# Patient Record
Sex: Female | Born: 1960 | Race: White | Hispanic: No | Marital: Married | Smoking: Current some day smoker
Health system: Southern US, Community
[De-identification: ages and names within clinical notes are randomized; demographics above are authoritative.]

## PROBLEM LIST (undated history)

## (undated) DIAGNOSIS — F329 Major depressive disorder, single episode, unspecified: Secondary | ICD-10-CM

## (undated) DIAGNOSIS — R000 Tachycardia, unspecified: Secondary | ICD-10-CM

## (undated) DIAGNOSIS — M797 Fibromyalgia: Secondary | ICD-10-CM

## (undated) DIAGNOSIS — B1910 Unspecified viral hepatitis B without hepatic coma: Secondary | ICD-10-CM

## (undated) DIAGNOSIS — M810 Age-related osteoporosis without current pathological fracture: Secondary | ICD-10-CM

## (undated) DIAGNOSIS — B1920 Unspecified viral hepatitis C without hepatic coma: Secondary | ICD-10-CM

## (undated) DIAGNOSIS — M549 Dorsalgia, unspecified: Secondary | ICD-10-CM

## (undated) DIAGNOSIS — E7800 Pure hypercholesterolemia, unspecified: Secondary | ICD-10-CM

## (undated) DIAGNOSIS — F32A Depression, unspecified: Secondary | ICD-10-CM

## (undated) DIAGNOSIS — F419 Anxiety disorder, unspecified: Secondary | ICD-10-CM

## (undated) HISTORY — PX: ROTATOR CUFF REPAIR: SHX139

## (undated) HISTORY — PX: CORNEAL TRANSPLANT: SHX108

---

## 1998-08-28 ENCOUNTER — Ambulatory Visit (HOSPITAL_COMMUNITY): Admission: RE | Admit: 1998-08-28 | Discharge: 1998-08-28 | Payer: Self-pay | Admitting: Sports Medicine

## 1998-08-28 ENCOUNTER — Encounter: Payer: Self-pay | Admitting: Sports Medicine

## 1998-09-26 ENCOUNTER — Ambulatory Visit (HOSPITAL_COMMUNITY): Admission: RE | Admit: 1998-09-26 | Discharge: 1998-09-26 | Payer: Self-pay | Admitting: Sports Medicine

## 1998-09-26 ENCOUNTER — Encounter: Payer: Self-pay | Admitting: Sports Medicine

## 1998-10-16 ENCOUNTER — Ambulatory Visit (HOSPITAL_COMMUNITY): Admission: RE | Admit: 1998-10-16 | Discharge: 1998-10-16 | Payer: Self-pay | Admitting: Family Medicine

## 1998-10-16 ENCOUNTER — Encounter: Payer: Self-pay | Admitting: Family Medicine

## 1998-11-16 ENCOUNTER — Encounter: Admission: RE | Admit: 1998-11-16 | Discharge: 1999-01-15 | Payer: Self-pay | Admitting: Orthopedic Surgery

## 1998-12-14 ENCOUNTER — Other Ambulatory Visit: Admission: RE | Admit: 1998-12-14 | Discharge: 1998-12-14 | Payer: Self-pay | Admitting: Family Medicine

## 1999-01-02 ENCOUNTER — Encounter: Payer: Self-pay | Admitting: Sports Medicine

## 1999-01-02 ENCOUNTER — Ambulatory Visit (HOSPITAL_COMMUNITY): Admission: RE | Admit: 1999-01-02 | Discharge: 1999-01-02 | Payer: Self-pay | Admitting: Sports Medicine

## 2000-08-21 ENCOUNTER — Ambulatory Visit (HOSPITAL_COMMUNITY): Admission: RE | Admit: 2000-08-21 | Discharge: 2000-08-21 | Payer: Self-pay | Admitting: *Deleted

## 2001-08-15 ENCOUNTER — Emergency Department (HOSPITAL_COMMUNITY): Admission: EM | Admit: 2001-08-15 | Discharge: 2001-08-15 | Payer: Self-pay | Admitting: Emergency Medicine

## 2001-08-24 ENCOUNTER — Ambulatory Visit (HOSPITAL_COMMUNITY): Admission: RE | Admit: 2001-08-24 | Discharge: 2001-08-24 | Payer: Self-pay | Admitting: *Deleted

## 2001-08-28 ENCOUNTER — Encounter: Admission: RE | Admit: 2001-08-28 | Discharge: 2001-08-28 | Payer: Self-pay | Admitting: *Deleted

## 2002-01-24 ENCOUNTER — Encounter: Payer: Self-pay | Admitting: Sports Medicine

## 2002-01-24 ENCOUNTER — Encounter: Admission: RE | Admit: 2002-01-24 | Discharge: 2002-01-24 | Payer: Self-pay | Admitting: Sports Medicine

## 2002-02-07 ENCOUNTER — Encounter: Admission: RE | Admit: 2002-02-07 | Discharge: 2002-02-07 | Payer: Self-pay | Admitting: Sports Medicine

## 2002-02-07 ENCOUNTER — Encounter: Payer: Self-pay | Admitting: Sports Medicine

## 2002-02-21 ENCOUNTER — Encounter: Payer: Self-pay | Admitting: Sports Medicine

## 2002-02-21 ENCOUNTER — Encounter: Admission: RE | Admit: 2002-02-21 | Discharge: 2002-02-21 | Payer: Self-pay | Admitting: Sports Medicine

## 2002-08-31 ENCOUNTER — Ambulatory Visit (HOSPITAL_COMMUNITY): Admission: RE | Admit: 2002-08-31 | Discharge: 2002-08-31 | Payer: Self-pay | Admitting: Family Medicine

## 2002-08-31 ENCOUNTER — Encounter: Payer: Self-pay | Admitting: Family Medicine

## 2003-02-10 ENCOUNTER — Ambulatory Visit (HOSPITAL_COMMUNITY): Admission: RE | Admit: 2003-02-10 | Discharge: 2003-02-10 | Payer: Self-pay | Admitting: Internal Medicine

## 2003-03-13 ENCOUNTER — Other Ambulatory Visit: Admission: RE | Admit: 2003-03-13 | Discharge: 2003-03-13 | Payer: Self-pay | Admitting: Family Medicine

## 2009-02-12 ENCOUNTER — Ambulatory Visit: Payer: Self-pay | Admitting: Occupational Medicine

## 2009-02-12 DIAGNOSIS — I10 Essential (primary) hypertension: Secondary | ICD-10-CM | POA: Insufficient documentation

## 2009-02-12 DIAGNOSIS — E785 Hyperlipidemia, unspecified: Secondary | ICD-10-CM | POA: Insufficient documentation

## 2009-02-12 DIAGNOSIS — G609 Hereditary and idiopathic neuropathy, unspecified: Secondary | ICD-10-CM | POA: Insufficient documentation

## 2009-02-12 DIAGNOSIS — F329 Major depressive disorder, single episode, unspecified: Secondary | ICD-10-CM | POA: Insufficient documentation

## 2009-02-12 DIAGNOSIS — F411 Generalized anxiety disorder: Secondary | ICD-10-CM | POA: Insufficient documentation

## 2010-03-30 ENCOUNTER — Encounter: Payer: Self-pay | Admitting: Family Medicine

## 2013-09-09 ENCOUNTER — Encounter (HOSPITAL_BASED_OUTPATIENT_CLINIC_OR_DEPARTMENT_OTHER): Payer: Self-pay | Admitting: Emergency Medicine

## 2013-09-09 ENCOUNTER — Emergency Department (HOSPITAL_BASED_OUTPATIENT_CLINIC_OR_DEPARTMENT_OTHER)
Admission: EM | Admit: 2013-09-09 | Discharge: 2013-09-09 | Disposition: A | Discharge status: Home / Self Care | Payer: Medicaid Other | Attending: Emergency Medicine | Admitting: Emergency Medicine

## 2013-09-09 DIAGNOSIS — Z79899 Other long term (current) drug therapy: Secondary | ICD-10-CM | POA: Insufficient documentation

## 2013-09-09 DIAGNOSIS — G8929 Other chronic pain: Secondary | ICD-10-CM

## 2013-09-09 DIAGNOSIS — R000 Tachycardia, unspecified: Secondary | ICD-10-CM | POA: Insufficient documentation

## 2013-09-09 DIAGNOSIS — H9209 Otalgia, unspecified ear: Secondary | ICD-10-CM | POA: Diagnosis present

## 2013-09-09 DIAGNOSIS — Z760 Encounter for issue of repeat prescription: Secondary | ICD-10-CM | POA: Insufficient documentation

## 2013-09-09 DIAGNOSIS — Z8619 Personal history of other infectious and parasitic diseases: Secondary | ICD-10-CM | POA: Diagnosis not present

## 2013-09-09 DIAGNOSIS — E7800 Pure hypercholesterolemia, unspecified: Secondary | ICD-10-CM | POA: Diagnosis not present

## 2013-09-09 DIAGNOSIS — F411 Generalized anxiety disorder: Secondary | ICD-10-CM | POA: Diagnosis not present

## 2013-09-09 DIAGNOSIS — F17200 Nicotine dependence, unspecified, uncomplicated: Secondary | ICD-10-CM | POA: Diagnosis not present

## 2013-09-09 DIAGNOSIS — F329 Major depressive disorder, single episode, unspecified: Secondary | ICD-10-CM | POA: Diagnosis not present

## 2013-09-09 DIAGNOSIS — F3289 Other specified depressive episodes: Secondary | ICD-10-CM | POA: Insufficient documentation

## 2013-09-09 HISTORY — DX: Depression, unspecified: F32.A

## 2013-09-09 HISTORY — DX: Fibromyalgia: M79.7

## 2013-09-09 HISTORY — DX: Pure hypercholesterolemia, unspecified: E78.00

## 2013-09-09 HISTORY — DX: Unspecified viral hepatitis B without hepatic coma: B19.10

## 2013-09-09 HISTORY — DX: Unspecified viral hepatitis C without hepatic coma: B19.20

## 2013-09-09 HISTORY — DX: Major depressive disorder, single episode, unspecified: F32.9

## 2013-09-09 HISTORY — DX: Anxiety disorder, unspecified: F41.9

## 2013-09-09 MED ORDER — OXYCODONE-ACETAMINOPHEN 5-325 MG PO TABS
2.0000 | ORAL_TABLET | Freq: Once | ORAL | Status: AC
  Administered 2013-09-09: 2 via ORAL
  Filled 2013-09-09: qty 2

## 2013-09-09 MED ORDER — NAPROXEN 500 MG PO TABS: 500.0000 mg | ORAL_TABLET | Freq: Two times a day (BID) | ORAL | Status: AC

## 2013-09-09 NOTE — ED Provider Notes (Signed)
CSN: [id number]     Arrival date & time 09/09/13  1702 History   First MD Initiated Contact with Patient 09/09/13 1909     Chief Complaint  Patient presents with  . Otalgia  . Medication Refill     (Consider location/radiation/quality/duration/timing/severity/associated sxs/prior Treatment) The history is provided by the patient.   Kristy Maldonado is a 53 y.o. female who presents to the ED with chronic pain. She states she has been using Fentanyl patches and oxycodone for about 4 years. She saw her doctor in pain management 08/26/2013 and did not refill her medication because she was short on her pill count. She was 20 pills short.  She states she is having Opiate withdrawal and having cramps due to that. Has been off her medication x 6 days. She used her last Fentanyl patch on June 21. She does not want more Fentanyl. She wants a stronger dose of oxycodone. She also complains of ear pain on the left.   Past Medical History  Diagnosis Date  . Depression   . Fibromyalgia   . Anxiety   . High cholesterol   . Hepatitis B   . Hepatitis C    Past Surgical History  Procedure Laterality Date  . Rotator cuff repair    . Cesarean section     No family history on file. History  Substance Use Topics  . Smoking status: Current Every Day Smoker -- 0.50 packs/day    Types: Cigarettes  . Smokeless tobacco: Not on file  . Alcohol Use: No   OB History   Grav Para Term Preterm Abortions TAB SAB Ect Mult Living                 Review of Systems As stated in HPI   Allergies  Sulfa antibiotics  Home Medications   Prior to Admission medications   Medication Sig Start Date End Date Taking? Authorizing Provider  ARIPiprazole (ABILIFY PO) Take by mouth.   Yes Historical Provider, MD  atorvastatin (LIPITOR) 10 MG tablet Take 10 mg by mouth daily.   Yes Historical Provider, MD  DULoxetine HCl (CYMBALTA PO) Take by mouth.   Yes Historical Provider, MD  ezetimibe (ZETIA) 10 MG tablet Take  10 mg by mouth daily.   Yes Historical Provider, MD  SPIRONOLACTONE PO Take by mouth.   Yes Historical Provider, MD   BP 128/70  Pulse 103  Temp(Src) 98 F (36.7 C) (Oral)  Resp 20  Ht 5\' 3"  (1.6 m)  Wt 176 lb (79.833 kg)  BMI 31.18 kg/m2  SpO2 100% Physical Exam  Nursing note and vitals reviewed. Constitutional: She is oriented to person, place, and time. She appears well-developed and well-nourished. No distress.  HENT:  Head: Normocephalic.  Right Ear: Tympanic membrane normal.  Left Ear: Tympanic membrane normal.  Mouth/Throat: Uvula is midline, oropharynx is clear and moist and mucous membranes are normal.  Eyes: EOM are normal.  Neck: Neck supple.  Cardiovascular: Regular rhythm.  Tachycardia present.   Pulmonary/Chest: Effort normal. She has no wheezes. She has no rales.  Abdominal: Soft. There is no tenderness.  Musculoskeletal: Normal range of motion.  Neurological: She is alert and oriented to person, place, and time. She has normal strength and normal reflexes. No cranial nerve deficit or sensory deficit. Gait normal.  Skin: Skin is warm and dry.  Psychiatric: She has a normal mood and affect. Her behavior is normal.    ED Course  Procedures MDM  53 y.o. female with  chronic pain. Discussed with the patient that we do not treat chronic pain in the ED and she will need to go back to them for treatment. Patient given one dose of oxycodone here in the ED. Discussed with the patient and all questioned fully answered. Stable for discharge without neuro deficits.     [facility] Orlene OchM Neese, [place] 09/10/13 [phone]

## 2013-09-09 NOTE — Discharge Instructions (Signed)
We do not treat chronic pain in the ED. That should be handled by your pain management clinic. There should be a doctor on call for the clinic. You will need to call them to discuss your need for additional narcotics.

## 2013-09-09 NOTE — ED Notes (Signed)
Pt. Reports she has been on oxycodone and fentanyl for years.  Pt. Reports she has not had her medicine filled since she saw her Dr. On June 19th.  Dr. At pain clinic.    Pt. Reports she got in a argument with her Dr. And he said he was not going to rewrite her scripts.  Pt. Reports she takes meds for chronic back pain issues and now reports ear ache today on the L ear.  Pt. In no distress and reports she is having some withdrawals from her meds.  Pt. Reports running out of fentanyl patches since end of June and had a few oxycodone and took last one today.  Pt. [facility] [place].  Pt. Was actually put out of the pain clinic.  Pt. Reports she was told to come to the ED.

## 2013-09-09 NOTE — ED Notes (Signed)
Pt reports she drove herself , informed pt she will not get percoet if she doesn't have a ride Pt to call sister for ride pt given 15 mins to wait till she is discharged

## 2013-09-09 NOTE — ED Notes (Signed)
Pain in her left ear. States she ran out of Percocet and Fentanyl patches and has been going through withdrawals. Wants a refill.

## 2013-09-14 NOTE — ED Provider Notes (Signed)
Medical screening examination/treatment/procedure(s) were conducted as a shared visit with non-physician practitioner(s) and myself.  I personally evaluated the patient during the encounter.   EKG Interpretation None        Vanetta MuldersScott Kamdin Follett, MD 09/14/13 1241

## 2013-10-12 ENCOUNTER — Encounter (HOSPITAL_BASED_OUTPATIENT_CLINIC_OR_DEPARTMENT_OTHER): Payer: Self-pay | Admitting: Emergency Medicine

## 2013-10-12 ENCOUNTER — Emergency Department (HOSPITAL_BASED_OUTPATIENT_CLINIC_OR_DEPARTMENT_OTHER)
Admission: EM | Admit: 2013-10-12 | Discharge: 2013-10-12 | Disposition: A | Discharge status: Home / Self Care | Payer: Medicaid Other | Attending: Emergency Medicine | Admitting: Emergency Medicine

## 2013-10-12 ENCOUNTER — Emergency Department (HOSPITAL_BASED_OUTPATIENT_CLINIC_OR_DEPARTMENT_OTHER): Payer: Medicaid Other

## 2013-10-12 DIAGNOSIS — Z79899 Other long term (current) drug therapy: Secondary | ICD-10-CM | POA: Diagnosis not present

## 2013-10-12 DIAGNOSIS — Z8619 Personal history of other infectious and parasitic diseases: Secondary | ICD-10-CM | POA: Insufficient documentation

## 2013-10-12 DIAGNOSIS — Z91040 Latex allergy status: Secondary | ICD-10-CM | POA: Insufficient documentation

## 2013-10-12 DIAGNOSIS — F329 Major depressive disorder, single episode, unspecified: Secondary | ICD-10-CM | POA: Insufficient documentation

## 2013-10-12 DIAGNOSIS — W268XXA Contact with other sharp object(s), not elsewhere classified, initial encounter: Secondary | ICD-10-CM | POA: Insufficient documentation

## 2013-10-12 DIAGNOSIS — F17200 Nicotine dependence, unspecified, uncomplicated: Secondary | ICD-10-CM | POA: Diagnosis not present

## 2013-10-12 DIAGNOSIS — Z791 Long term (current) use of non-steroidal anti-inflammatories (NSAID): Secondary | ICD-10-CM | POA: Insufficient documentation

## 2013-10-12 DIAGNOSIS — Y9289 Other specified places as the place of occurrence of the external cause: Secondary | ICD-10-CM | POA: Insufficient documentation

## 2013-10-12 DIAGNOSIS — Y9389 Activity, other specified: Secondary | ICD-10-CM | POA: Diagnosis not present

## 2013-10-12 DIAGNOSIS — E7800 Pure hypercholesterolemia, unspecified: Secondary | ICD-10-CM | POA: Insufficient documentation

## 2013-10-12 DIAGNOSIS — Z8739 Personal history of other diseases of the musculoskeletal system and connective tissue: Secondary | ICD-10-CM | POA: Insufficient documentation

## 2013-10-12 DIAGNOSIS — F411 Generalized anxiety disorder: Secondary | ICD-10-CM | POA: Insufficient documentation

## 2013-10-12 DIAGNOSIS — Z23 Encounter for immunization: Secondary | ICD-10-CM | POA: Insufficient documentation

## 2013-10-12 DIAGNOSIS — S51832A Puncture wound without foreign body of left forearm, initial encounter: Secondary | ICD-10-CM

## 2013-10-12 DIAGNOSIS — S51809A Unspecified open wound of unspecified forearm, initial encounter: Secondary | ICD-10-CM | POA: Diagnosis present

## 2013-10-12 DIAGNOSIS — F3289 Other specified depressive episodes: Secondary | ICD-10-CM | POA: Diagnosis not present

## 2013-10-12 HISTORY — DX: Age-related osteoporosis without current pathological fracture: M81.0

## 2013-10-12 HISTORY — DX: Dorsalgia, unspecified: M54.9

## 2013-10-12 MED ORDER — TETANUS-DIPHTH-ACELL PERTUSSIS 5-2.5-18.5 LF-MCG/0.5 IM SUSP
0.5000 mL | Freq: Once | INTRAMUSCULAR | Status: AC
  Administered 2013-10-12: 0.5 mL via INTRAMUSCULAR
  Filled 2013-10-12: qty 0.5

## 2013-10-12 NOTE — Discharge Instructions (Signed)
You were treated for the pitch fork puncture wound to your left forearm. Overall the wound looks good. The bleeding has stopped, and we are reassured that there was no major injury. X-rays showed that there is no fracture. We recommend that you keep it wrapped with pressure dressing for now. Elevate and keep some ice on it if it starts to swell. Take Tylenol and Ibuprofen for pain. You were given a Tdap injection here, good for 10 years.  Please follow-up with your primary doctor in 2 days, recommend by Friday to have it re-evaluated prior to the weekend to make sure that it is still healing well.  If you develop worsening forearm or hand pain, localized swelling, redness that extends from the wound, increased bleeding, drainage of pus, worsening numbness or tingling in your hand, please call your regular doctor or seek immediate medical attention, return to Emergency Department.

## 2013-10-12 NOTE — ED Provider Notes (Signed)
CSN: [id number]     Arrival date & time 10/12/13  1338 History   First MD Initiated Contact with Patient 10/12/13 1353   History provided by patient.   Chief Complaint  Patient presents with  . Puncture Wound   HPI  Patient reports that around 1300 today she accidentally received a Right forearm puncture wound from a rusty pitchfork. Injury is a single puncture wound without exit wound, entry is about 1cm circumference and surrounding skin is intact. Reported that she was doing yardwork, and while pulling weeds next to her deck a pitchfork slid off the deck and hit her right arm, only one prong hit her. She stated that it remained "stuck in her arm" and she had to "pull it out", she was concerned that it "felt like it hit the bone". Immediately after the pitchfork was removed, described significant bleeding for about 10-15 min, improved with applied pressure. Denies fevers/chills, numbness, tingling, weakness.  Patient is unsure when last received Tetanus shot.  Past Medical History  Diagnosis Date  . Depression   . Fibromyalgia   . Anxiety   . High cholesterol   . Hepatitis B   . Hepatitis C   . Back pain   . Osteoporosis    Past Surgical History  Procedure Laterality Date  . Rotator cuff repair    . Cesarean section     No family history on file. History  Substance Use Topics  . Smoking status: Current Some Day Smoker -- 0.50 packs/day    Types: Cigarettes  . Smokeless tobacco: Not on file  . Alcohol Use: No   OB History   Grav Para Term Preterm Abortions TAB SAB Ect Mult Living                 Review of Systems    Allergies  Latex; Nickel; Other; and Sulfa antibiotics  Home Medications   Prior to Admission medications   Medication Sig Start Date End Date Taking? Authorizing Provider  ARIPiprazole (ABILIFY PO) Take by mouth.    Historical Provider, MD  atorvastatin (LIPITOR) 10 MG tablet Take 10 mg by mouth daily.    Historical Provider, MD  DULoxetine HCl  (CYMBALTA PO) Take by mouth.    Historical Provider, MD  ezetimibe (ZETIA) 10 MG tablet Take 10 mg by mouth daily.    Historical Provider, MD  naproxen (NAPROSYN) 500 MG tablet Take 1 tablet (500 mg total) by mouth 2 (two) times daily. 09/09/13   Hope Orlene OchM Neese, NP  SPIRONOLACTONE PO Take by mouth.    Historical Provider, MD   BP 147/68  Pulse 102  Temp(Src) 98.2 F (36.8 C) (Oral)  Ht 5\' 3"  (1.6 m)  Wt 175 lb (79.379 kg)  BMI 31.01 kg/m2  SpO2 98% Physical Exam  Gen - well-appearing, NAD Neck - supple Heart - mild tachycardia, regular rhythm, no murmurs heard Lungs - CTAB Abd - soft, NTND, no masses, +active BS Ext - Right forearm: 1cm superficial appearing puncture wound with slight surrounding abrasion, no local or extending erythema, minimal local / distal edema, +tenderness to palpation directly over wound and some tenderness anterior distal wrist, no drainage or bleeding. Intact peripheral pulses +2 Skin - warm, dry, no rashes Neuro - awake, alert, oriented, grossly non-focal, intact muscle strength 5/5 b/l, intact distal sensation to light touch   ED Course  Procedures (including critical care time) Labs Review Labs Reviewed - No data to display  Imaging Review Dg Forearm Right  10/12/2013  CLINICAL DATA:  Puncture wound to the right forearm.  EXAM: RIGHT FOREARM - 2 VIEW  COMPARISON:  No priors.  FINDINGS: Multiple views of the right radius or ulna demonstrate no acute displaced fracture, subluxation, dislocation, or soft tissue abnormality. No retained radiopaque foreign body in the soft tissues.  IMPRESSION: 1.  No acute radiographic abnormality of the right radius or ulna. 2. No retained radiopaque foreign body in the soft tissues.   Electronically Signed   By: Trudie Reed M.D.   On: 10/12/2013 14:04     EKG Interpretation None      MDM   Final diagnoses:  Puncture wound of left forearm, initial encounter   29 yr F who presented s/p acute puncture wound to  Right forearm with rusty pitchfork today @ 1300, single 1cm entry wound w/o exit, bleeding stopped with pressure after 10-15 min pressure. On arrival, no active bleeding, local pain, Right arm/hand neurovascularly intact with good distal pulses. Vitals stable. Suspected single puncture injury, without evidence of complication.  Proceed with Right forearm X-ray.  UPDATE @ 1401 - X-ray without any acute bony or soft tissue abnormality. No evidence of foreign body. Wound was cleaned and dressed appropriately. No further bleeding. Ordered Tdap vaccine x 1 dose. No indication for antibiotics at this time.  Discharge to home with reassurance, advised on elevation and icing if develops edema, recommended close f/u within 2 days for re-evaluation, return precautions given.    Saralyn Pilar, DO 10/12/13 1609

## 2013-10-12 NOTE — ED Provider Notes (Signed)
Pt with small abrasion/puncture wound to left forearm.  No bony involvement.  No active bleeding.  No motor dysfunction.  Pt with some numbness to thumb and 1st 2 fingers, but has a hx of carpal tunnel, and has had this before.    Kristy BuccoMelanie Synethia Endicott, MD 10/12/13 1515

## 2013-10-12 NOTE — ED Notes (Signed)
Pitch fork to right forearm approx 20-30 min PTA-puncture wound noted-no bleeding

## 2013-10-13 NOTE — ED Provider Notes (Signed)
I saw and evaluated the patient, reviewed the resident's note and I agree with the findings and plan.   EKG Interpretation None      Small abrasion/puncture wound to volar surface of right forearm.  Normal motor function, no bony involvement.  Some numbness to LT thumb and 1st 2 fingers (pt says that this is due to her carpal tunnel).  TDAP updated, wound care instructions given  Rolan BuccoMelanie Anay Walter, MD 10/13/13 [phone]

## 2014-07-17 ENCOUNTER — Emergency Department (HOSPITAL_BASED_OUTPATIENT_CLINIC_OR_DEPARTMENT_OTHER): Payer: Medicaid Other

## 2014-07-17 ENCOUNTER — Emergency Department (HOSPITAL_BASED_OUTPATIENT_CLINIC_OR_DEPARTMENT_OTHER)
Admission: EM | Admit: 2014-07-17 | Discharge: 2014-07-17 | Disposition: A | Discharge status: Home / Self Care | Payer: Medicaid Other | Attending: Emergency Medicine | Admitting: Emergency Medicine

## 2014-07-17 ENCOUNTER — Encounter (HOSPITAL_BASED_OUTPATIENT_CLINIC_OR_DEPARTMENT_OTHER): Payer: Self-pay | Admitting: *Deleted

## 2014-07-17 DIAGNOSIS — Y929 Unspecified place or not applicable: Secondary | ICD-10-CM | POA: Insufficient documentation

## 2014-07-17 DIAGNOSIS — Z91040 Latex allergy status: Secondary | ICD-10-CM | POA: Diagnosis not present

## 2014-07-17 DIAGNOSIS — Y990 Civilian activity done for income or pay: Secondary | ICD-10-CM | POA: Insufficient documentation

## 2014-07-17 DIAGNOSIS — Y939 Activity, unspecified: Secondary | ICD-10-CM | POA: Insufficient documentation

## 2014-07-17 DIAGNOSIS — S161XXA Strain of muscle, fascia and tendon at neck level, initial encounter: Secondary | ICD-10-CM | POA: Diagnosis not present

## 2014-07-17 DIAGNOSIS — S80211A Abrasion, right knee, initial encounter: Secondary | ICD-10-CM | POA: Insufficient documentation

## 2014-07-17 DIAGNOSIS — Z79899 Other long term (current) drug therapy: Secondary | ICD-10-CM | POA: Insufficient documentation

## 2014-07-17 DIAGNOSIS — Z720 Tobacco use: Secondary | ICD-10-CM | POA: Insufficient documentation

## 2014-07-17 DIAGNOSIS — Z791 Long term (current) use of non-steroidal anti-inflammatories (NSAID): Secondary | ICD-10-CM | POA: Diagnosis not present

## 2014-07-17 DIAGNOSIS — F419 Anxiety disorder, unspecified: Secondary | ICD-10-CM | POA: Diagnosis not present

## 2014-07-17 DIAGNOSIS — F329 Major depressive disorder, single episode, unspecified: Secondary | ICD-10-CM | POA: Diagnosis not present

## 2014-07-17 DIAGNOSIS — E780 Pure hypercholesterolemia: Secondary | ICD-10-CM | POA: Diagnosis not present

## 2014-07-17 DIAGNOSIS — S199XXA Unspecified injury of neck, initial encounter: Secondary | ICD-10-CM | POA: Diagnosis present

## 2014-07-17 DIAGNOSIS — Z8619 Personal history of other infectious and parasitic diseases: Secondary | ICD-10-CM | POA: Diagnosis not present

## 2014-07-17 DIAGNOSIS — M797 Fibromyalgia: Secondary | ICD-10-CM | POA: Diagnosis not present

## 2014-07-17 MED ORDER — KETOROLAC TROMETHAMINE 30 MG/ML IJ SOLN
INTRAMUSCULAR | Status: AC
  Administered 2014-07-17: 60 mg
  Filled 2014-07-17: qty 2

## 2014-07-17 MED ORDER — KETOROLAC TROMETHAMINE 60 MG/2ML IM SOLN: 60.0000 mg | Freq: Once | INTRAMUSCULAR | Status: AC

## 2014-07-17 MED ORDER — MELOXICAM 15 MG PO TABS: 15.0000 mg | ORAL_TABLET | Freq: Every day | ORAL | Status: AC

## 2014-07-17 MED ORDER — METHOCARBAMOL 500 MG PO TABS
1000.0000 mg | ORAL_TABLET | Freq: Once | ORAL | Status: AC
  Administered 2014-07-17: 1000 mg via ORAL
  Filled 2014-07-17: qty 2

## 2014-07-17 MED ORDER — METHOCARBAMOL 500 MG PO TABS: 500.0000 mg | ORAL_TABLET | Freq: Two times a day (BID) | ORAL | Status: AC

## 2014-07-17 NOTE — ED Notes (Signed)
Dr. Palumbo at [facility] 

## 2014-07-17 NOTE — Discharge Instructions (Signed)
Cervical Strain and Sprain (Whiplash) with Rehab Cervical strain and sprain are injuries that commonly occur with "whiplash" injuries. Whiplash occurs when the neck is forcefully whipped backward or forward, such as during a motor vehicle accident or during contact sports. The muscles, ligaments, tendons, discs, and nerves of the neck are susceptible to injury when this occurs. RISK FACTORS Risk of having a whiplash injury increases if:  Osteoarthritis of the spine.  Situations that make head or neck accidents or trauma more likely.  High-risk sports (football, rugby, wrestling, hockey, auto racing, gymnastics, diving, contact karate, or boxing).  Poor strength and flexibility of the neck.  Previous neck injury.  Poor tackling technique.  Improperly fitted or padded equipment. SYMPTOMS   Pain or stiffness in the front or back of neck or both.  Symptoms may present immediately or up to 24 hours after injury.  Dizziness, headache, nausea, and vomiting.  Muscle spasm with soreness and stiffness in the neck.  Tenderness and swelling at the injury site. PREVENTION  Learn and use proper technique (avoid tackling with the head, spearing, and head-butting; use proper falling techniques to avoid landing on the head).  Warm up and stretch properly before activity.  Maintain physical fitness:  Strength, flexibility, and endurance.  Cardiovascular fitness.  Wear properly fitted and padded protective equipment, such as padded soft collars, for participation in contact sports. PROGNOSIS  Recovery from cervical strain and sprain injuries is dependent on the extent of the injury. These injuries are usually curable in 1 week to 3 months with appropriate treatment.  RELATED COMPLICATIONS   Temporary numbness and weakness may occur if the nerve roots are damaged, and this may persist until the nerve has completely healed.  Chronic pain due to frequent recurrence of  symptoms.  Prolonged healing, especially if activity is resumed too soon (before complete recovery). TREATMENT  Treatment initially involves the use of ice and medication to help reduce pain and inflammation. It is also important to perform strengthening and stretching exercises and modify activities that worsen symptoms so the injury does not get worse. These exercises may be performed at home or with a therapist. For patients who experience severe symptoms, a soft, padded collar may be recommended to be worn around the neck.  Improving your posture may help reduce symptoms. Posture improvement includes pulling your chin and abdomen in while sitting or standing. If you are sitting, sit in a firm chair with your buttocks against the back of the chair. While sleeping, try replacing your pillow with a small towel rolled to 2 inches in diameter, or use a cervical pillow or soft cervical collar. Poor sleeping positions delay healing.  For patients with nerve root damage, which causes numbness or weakness, the use of a cervical traction apparatus may be recommended. Surgery is rarely necessary for these injuries. However, cervical strain and sprains that are present at birth (congenital) may require surgery. MEDICATION   If pain medication is necessary, nonsteroidal anti-inflammatory medications, such as aspirin and ibuprofen, or other minor pain relievers, such as acetaminophen, are often recommended.  Do not take pain medication for 7 days before surgery.  Prescription pain relievers may be given if deemed necessary by your caregiver. Use only as directed and only as much as you need. HEAT AND COLD:   Cold treatment (icing) relieves pain and reduces inflammation. Cold treatment should be applied for 10 to 15 minutes every 2 to 3 hours for inflammation and pain and immediately after any activity that aggravates   your symptoms. Use ice packs or an ice massage.  Heat treatment may be used prior to  performing the stretching and strengthening activities prescribed by your caregiver, physical therapist, or athletic trainer. Use a heat pack or a warm soak. SEEK MEDICAL CARE IF:   Symptoms get worse or do not improve in 2 weeks despite treatment.  New, unexplained symptoms develop (drugs used in treatment may produce side effects). EXERCISES RANGE OF MOTION (ROM) AND STRETCHING EXERCISES - Cervical Strain and Sprain These exercises may help you when beginning to rehabilitate your injury. In order to successfully resolve your symptoms, you must improve your posture. These exercises are designed to help reduce the forward-head and rounded-shoulder posture which contributes to this condition. Your symptoms may resolve with or without further involvement from your physician, physical therapist or athletic trainer. While completing these exercises, remember:   Restoring tissue flexibility helps normal motion to return to the joints. This allows healthier, less painful movement and activity.  An effective stretch should be held for at least 20 seconds, although you may need to begin with shorter hold times for comfort.  A stretch should never be painful. You should only feel a gentle lengthening or release in the stretched tissue. STRETCH- Axial Extensors  Lie on your back on the floor. You may bend your knees for comfort. Place a rolled-up hand towel or dish towel, about 2 inches in diameter, under the part of your head that makes contact with the floor.  Gently tuck your chin, as if trying to make a "double chin," until you feel a gentle stretch at the base of your head.  Hold __________ seconds. Repeat __________ times. Complete this exercise __________ times per day.  STRETCH - Axial Extension   Stand or sit on a firm surface. Assume a good posture: chest up, shoulders drawn back, abdominal muscles slightly tense, knees unlocked (if standing) and feet hip width apart.  Slowly retract your  chin so your head slides back and your chin slightly lowers. Continue to look straight ahead.  You should feel a gentle stretch in the back of your head. Be certain not to feel an aggressive stretch since this can cause headaches later.  Hold for __________ seconds. Repeat __________ times. Complete this exercise __________ times per day. STRETCH - Cervical Side Bend   Stand or sit on a firm surface. Assume a good posture: chest up, shoulders drawn back, abdominal muscles slightly tense, knees unlocked (if standing) and feet hip width apart.  Without letting your nose or shoulders move, slowly tip your right / left ear to your shoulder until your feel a gentle stretch in the muscles on the opposite side of your neck.  Hold __________ seconds. Repeat __________ times. Complete this exercise __________ times per day. STRETCH - Cervical Rotators   Stand or sit on a firm surface. Assume a good posture: chest up, shoulders drawn back, abdominal muscles slightly tense, knees unlocked (if standing) and feet hip width apart.  Keeping your eyes level with the ground, slowly turn your head until you feel a gentle stretch along the back and opposite side of your neck.  Hold __________ seconds. Repeat __________ times. Complete this exercise __________ times per day. RANGE OF MOTION - Neck Circles   Stand or sit on a firm surface. Assume a good posture: chest up, shoulders drawn back, abdominal muscles slightly tense, knees unlocked (if standing) and feet hip width apart.  Gently roll your head down and around from the   back of one shoulder to the back of the other. The motion should never be forced or painful.  Repeat the motion 10-20 times, or until you feel the neck muscles relax and loosen. Repeat __________ times. Complete the exercise __________ times per day. STRENGTHENING EXERCISES - Cervical Strain and Sprain These exercises may help you when beginning to rehabilitate your injury. They may  resolve your symptoms with or without further involvement from your physician, physical therapist, or athletic trainer. While completing these exercises, remember:   Muscles can gain both the endurance and the strength needed for everyday activities through controlled exercises.  Complete these exercises as instructed by your physician, physical therapist, or athletic trainer. Progress the resistance and repetitions only as guided.  You may experience muscle soreness or fatigue, but the pain or discomfort you are trying to eliminate should never worsen during these exercises. If this pain does worsen, stop and make certain you are following the directions exactly. If the pain is still present after adjustments, discontinue the exercise until you can discuss the trouble with your clinician. STRENGTH - Cervical Flexors, Isometric  Face a wall, standing about 6 inches away. Place a small pillow, a ball about 6-8 inches in diameter, or a folded towel between your forehead and the wall.  Slightly tuck your chin and gently push your forehead into the soft object. Push only with mild to moderate intensity, building up tension gradually. Keep your jaw and forehead relaxed.  Hold 10 to 20 seconds. Keep your breathing relaxed.  Release the tension slowly. Relax your neck muscles completely before you start the next repetition. Repeat __________ times. Complete this exercise __________ times per day. STRENGTH- Cervical Lateral Flexors, Isometric   Stand about 6 inches away from a wall. Place a small pillow, a ball about 6-8 inches in diameter, or a folded towel between the side of your head and the wall.  Slightly tuck your chin and gently tilt your head into the soft object. Push only with mild to moderate intensity, building up tension gradually. Keep your jaw and forehead relaxed.  Hold 10 to 20 seconds. Keep your breathing relaxed.  Release the tension slowly. Relax your neck muscles completely  before you start the next repetition. Repeat __________ times. Complete this exercise __________ times per day. STRENGTH - Cervical Extensors, Isometric   Stand about 6 inches away from a wall. Place a small pillow, a ball about 6-8 inches in diameter, or a folded towel between the back of your head and the wall.  Slightly tuck your chin and gently tilt your head back into the soft object. Push only with mild to moderate intensity, building up tension gradually. Keep your jaw and forehead relaxed.  Hold 10 to 20 seconds. Keep your breathing relaxed.  Release the tension slowly. Relax your neck muscles completely before you start the next repetition. Repeat __________ times. Complete this exercise __________ times per day. POSTURE AND BODY MECHANICS CONSIDERATIONS - Cervical Strain and Sprain Keeping correct posture when sitting, standing or completing your activities will reduce the stress put on different body tissues, allowing injured tissues a chance to heal and limiting painful experiences. The following are general guidelines for improved posture. Your physician or physical therapist will provide you with any instructions specific to your needs. While reading these guidelines, remember:  The exercises prescribed by your provider will help you have the flexibility and strength to maintain correct postures.  The correct posture provides the optimal environment for your joints to   work. All of your joints have less wear and tear when properly supported by a spine with good posture. This means you will experience a healthier, less painful body.  Correct posture must be practiced with all of your activities, especially prolonged sitting and standing. Correct posture is as important when doing repetitive low-stress activities (typing) as it is when doing a single heavy-load activity (lifting). PROLONGED STANDING WHILE SLIGHTLY LEANING FORWARD When completing a task that requires you to lean  forward while standing in one place for a long time, place either foot up on a stationary 2- to 4-inch high object to help maintain the best posture. When both feet are on the ground, the low back tends to lose its slight inward curve. If this curve flattens (or becomes too large), then the back and your other joints will experience too much stress, fatigue more quickly, and can cause pain.  RESTING POSITIONS Consider which positions are most painful for you when choosing a resting position. If you have pain with flexion-based activities (sitting, bending, stooping, squatting), choose a position that allows you to rest in a less flexed posture. You would want to avoid curling into a fetal position on your side. If your pain worsens with extension-based activities (prolonged standing, working overhead), avoid resting in an extended position such as sleeping on your stomach. Most people will find more comfort when they rest with their spine in a more neutral position, neither too rounded nor too arched. Lying on a non-sagging bed on your side with a pillow between your knees, or on your back with a pillow under your knees will often provide some relief. Keep in mind, being in any one position for a prolonged period of time, no matter how correct your posture, can still lead to stiffness. WALKING Walk with an upright posture. Your ears, shoulders, and hips should all line up. OFFICE WORK When working at a desk, create an environment that supports good, upright posture. Without extra support, muscles fatigue and lead to excessive strain on joints and other tissues. CHAIR:  A chair should be able to slide under your desk when your back makes contact with the back of the chair. This allows you to work closely.  The chair's height should allow your eyes to be level with the upper part of your monitor and your hands to be slightly lower than your elbows.  Body position:  Your feet should make contact with the  floor. If this is not possible, use a foot rest.  Keep your ears over your shoulders. This will reduce stress on your neck and low back. Document Released: 02/24/2005 Document Revised: 07/11/2013 Document Reviewed: 06/08/2008 [employer] Patient Information 2015 [employer]. This information is not intended to replace advice given to you by your health care provider. Make sure you discuss any questions you have with your health care provider.  
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
## 2014-07-17 NOTE — ED Notes (Signed)
Given Rx x2, denies needs or questions, out with friend, steady gait, "feel a little better".

## 2014-09-07 ENCOUNTER — Encounter (HOSPITAL_BASED_OUTPATIENT_CLINIC_OR_DEPARTMENT_OTHER): Payer: Self-pay | Admitting: Emergency Medicine

## 2014-09-07 ENCOUNTER — Emergency Department (HOSPITAL_BASED_OUTPATIENT_CLINIC_OR_DEPARTMENT_OTHER): Payer: Medicaid Other

## 2014-09-07 ENCOUNTER — Emergency Department (HOSPITAL_BASED_OUTPATIENT_CLINIC_OR_DEPARTMENT_OTHER)
Admission: EM | Admit: 2014-09-07 | Discharge: 2014-09-07 | Disposition: A | Discharge status: Home / Self Care | Payer: Medicaid Other | Attending: Emergency Medicine | Admitting: Emergency Medicine

## 2014-09-07 DIAGNOSIS — Z79899 Other long term (current) drug therapy: Secondary | ICD-10-CM | POA: Diagnosis not present

## 2014-09-07 DIAGNOSIS — F1123 Opioid dependence with withdrawal: Secondary | ICD-10-CM | POA: Diagnosis not present

## 2014-09-07 DIAGNOSIS — M549 Dorsalgia, unspecified: Secondary | ICD-10-CM | POA: Diagnosis not present

## 2014-09-07 DIAGNOSIS — F329 Major depressive disorder, single episode, unspecified: Secondary | ICD-10-CM | POA: Diagnosis not present

## 2014-09-07 DIAGNOSIS — E780 Pure hypercholesterolemia: Secondary | ICD-10-CM | POA: Diagnosis not present

## 2014-09-07 DIAGNOSIS — Z720 Tobacco use: Secondary | ICD-10-CM | POA: Diagnosis not present

## 2014-09-07 DIAGNOSIS — F419 Anxiety disorder, unspecified: Secondary | ICD-10-CM | POA: Diagnosis not present

## 2014-09-07 DIAGNOSIS — Z8619 Personal history of other infectious and parasitic diseases: Secondary | ICD-10-CM | POA: Insufficient documentation

## 2014-09-07 DIAGNOSIS — M797 Fibromyalgia: Secondary | ICD-10-CM | POA: Diagnosis not present

## 2014-09-07 DIAGNOSIS — Z91040 Latex allergy status: Secondary | ICD-10-CM | POA: Insufficient documentation

## 2014-09-07 DIAGNOSIS — Z791 Long term (current) use of non-steroidal anti-inflammatories (NSAID): Secondary | ICD-10-CM | POA: Diagnosis not present

## 2014-09-07 DIAGNOSIS — R110 Nausea: Secondary | ICD-10-CM | POA: Diagnosis not present

## 2014-09-07 DIAGNOSIS — G8929 Other chronic pain: Secondary | ICD-10-CM | POA: Insufficient documentation

## 2014-09-07 DIAGNOSIS — R0600 Dyspnea, unspecified: Secondary | ICD-10-CM | POA: Diagnosis not present

## 2014-09-07 DIAGNOSIS — R197 Diarrhea, unspecified: Secondary | ICD-10-CM | POA: Insufficient documentation

## 2014-09-07 DIAGNOSIS — F1193 Opioid use, unspecified with withdrawal: Secondary | ICD-10-CM

## 2014-09-07 DIAGNOSIS — R0602 Shortness of breath: Secondary | ICD-10-CM | POA: Diagnosis present

## 2014-09-07 LAB — CBC WITH DIFFERENTIAL/PLATELET
Basophils Absolute: 0 10*3/uL (ref 0.0–0.1)
Basophils Relative: 0 % (ref 0–1)
Eosinophils Absolute: 0.1 10*3/uL (ref 0.0–0.7)
Eosinophils Relative: 2 % (ref 0–5)
HCT: 45.6 % (ref 36.0–46.0)
Hemoglobin: 15.4 g/dL — ABNORMAL HIGH (ref 12.0–15.0)
Lymphocytes Relative: 12 % (ref 12–46)
Lymphs Abs: 0.9 10*3/uL (ref 0.7–4.0)
MCH: 31.6 pg (ref 26.0–34.0)
MCHC: 33.8 g/dL (ref 30.0–36.0)
MCV: 93.4 fL (ref 78.0–100.0)
Monocytes Absolute: 0.5 10*3/uL (ref 0.1–1.0)
Monocytes Relative: 7 % (ref 3–12)
Neutro Abs: 5.6 10*3/uL (ref 1.7–7.7)
Neutrophils Relative %: 79 % — ABNORMAL HIGH (ref 43–77)
Platelets: 280 10*3/uL (ref 150–400)
RBC: 4.88 MIL/uL (ref 3.87–5.11)
RDW: 12.7 % (ref 11.5–15.5)
WBC: 7.1 10*3/uL (ref 4.0–10.5)

## 2014-09-07 LAB — BASIC METABOLIC PANEL
Anion gap: 10 (ref 5–15)
BUN: 12 mg/dL (ref 6–20)
CO2: 25 mmol/L (ref 22–32)
Calcium: 9.8 mg/dL (ref 8.9–10.3)
Chloride: 102 mmol/L (ref 101–111)
Creatinine, Ser: 0.78 mg/dL (ref 0.44–1.00)
GFR calc Af Amer: >60 mL/min (ref >60)
GFR calc non Af Amer: >60 mL/min (ref >60)
Glucose, Bld: 110 mg/dL — ABNORMAL HIGH (ref 65–99)
Potassium: 4 mmol/L (ref 3.5–5.1)
Sodium: 137 mmol/L (ref 135–145)

## 2014-09-07 LAB — TROPONIN I: Troponin I: <0.03 ng/mL (ref <0.031)

## 2014-09-07 LAB — BRAIN NATRIURETIC PEPTIDE: B Natriuretic Peptide: 11.6 pg/mL (ref 0.0–100.0)

## 2014-09-07 NOTE — ED Notes (Signed)
Pt ambulated around nurses station without difficulty. Pt c/o dizziness and h/a while walking. Sats 98-100% and hrt rate 95-105 while ambulating.

## 2014-09-07 NOTE — ED Notes (Signed)
Pt in c/o SOB x 3 days, does not improve with rest. Pt is pain clinic pt and recently experienced a med change and suspects it may be related. Pt breathing even and unlabored in triage, breath sounds clear, but appears mildly anxious and is diaphoretic.

## 2014-09-07 NOTE — Discharge Instructions (Signed)
Opioid Withdrawal  Opioids are a group of narcotic drugs. They include the street drug heroin. They also include pain medicines, such as morphine, hydrocodone, oxycodone, and fentanyl. Opioid withdrawal is a group of characteristic physical and mental signs and symptoms. It typically occurs if you have been using opioids daily for several weeks or longer and stop using or rapidly decrease use. Opioid withdrawal can also occur if you have used opioids daily for a long time and are given a medicine to block the effect.   SIGNS AND SYMPTOMS  Opioid withdrawal includes three or more of the following symptoms:   · Depressed, anxious, or irritable mood.  · Nausea or vomiting.  · Muscle aches or spasms.    · Watery eyes.     · Runny nose.  · Dilated pupils, sweating, or hairs standing on end.  · Diarrhea or intestinal cramping.  · Yawning.    · Fever.  · Increased blood pressure.  · Fast pulse.  · Restlessness or trouble sleeping.  These signs and symptoms occur within several hours of stopping or reducing short-acting opioids, such as heroin. They can occur within 3 days of stopping or reducing long-acting opioids, such as methadone. Withdrawal begins within minutes of receiving a drug that blocks the effects of opioids, such as naltrexone or naloxone.  DIAGNOSIS   Opioid use disorder is diagnosed by your health care provider. You will be asked about your symptoms, drug and alcohol use, medical history, and use of medicines. A physical exam may be done. Lab tests may be ordered. Your health care provider may have you see a mental health professional.   TREATMENT   The treatment for opioid withdrawal is usually provided by medical doctors with special training in substance use disorders (addiction specialists). The following medicines may be included in treatment:  · Opioids given in place of the abused opioid. They turn on opioid receptors in the brain and lessen or prevent withdrawal symptoms. They are gradually  decreased (opioid substitution and taper).  · Non-opioids that can lessen certain opioid withdrawal symptoms. They may be used alone or with opioid substitution and taper.  Successful long-term recovery usually requires medicine, counseling, and group support.  HOME CARE INSTRUCTIONS   · Take medicines only as directed by your health care provider.  · Check with your health care provider before starting new medicines.  · Keep all follow-up visits as directed by your health care provider.  SEEK MEDICAL CARE IF:  · You are not able to take your medicines as directed.  · Your symptoms get worse.  · You relapse.  SEEK IMMEDIATE MEDICAL CARE IF:  · You have serious thoughts about hurting yourself or others.  · You have a seizure.  · You lose consciousness.  Document Released: 02/27/2003 Document Revised: 07/11/2013 Document Reviewed: 03/09/2013  [employer]® Patient Information ©2015 [employer]. This information is not intended to replace advice given to you by your health care provider. Make sure you discuss any questions you have with your health care provider.

## 2014-09-07 NOTE — ED Provider Notes (Signed)
CSN: [id number]     Arrival date & time 09/07/14  1232 History   First MD Initiated Contact with Patient 09/07/14 1250     Chief Complaint  Patient presents with  . Shortness of Breath     (Consider location/radiation/quality/duration/timing/severity/associated sxs/prior Treatment) HPI Comments: Patient presents with shortness of breath. She states that she has chronic pain in her low back and hips. She received treatment in the pain clinic. She states she's been on fentanyl and 10 mg oxycodone tablets for a long time. She states that a few weeks ago she was told by the pain clinic that her switching her from her normal pain medications to Suboxone. She started taking the Suboxone about a week ago. She states that the pharmacy gave her the wrong dose which was 8 mg versus her prescribed dose of 2 mg. She states that she had a bad reaction to Suboxone. She states that she went to her pain management physician yesterday who thought the Suboxone. She is currently not taking anything for pain management. She was told that when she follows up next week they may start her back on the oxycodone and fentanyl. She was given a prescription today for a clonidine patch which she currently is wearing for withdrawal symptoms. She states over the last 3 days she's had insomnia has felt more anxious and feels short of breath. She states she feels short of breath when she is just sitting here or when she is ambulating. She denies any cough or chest congestion. She has been a smoker but quit about 2 months ago. She denies any fevers or chills. She denies any chest pain or tightness. She had a little bit of leg swelling a few days ago but states is back to normal now. She denies any calf tenderness. She has been feeling anxious. She's had some nausea and loose stools. She denies any vomiting. She did take a Valium this morning with seem to help her symptoms a little bit.  Patient is a 54 y.o. female presenting with  shortness of breath.  Shortness of Breath Associated symptoms: no abdominal pain, no chest pain, no cough, no diaphoresis, no fever, no headaches, no rash and no vomiting     Past Medical History  Diagnosis Date  . Depression   . Fibromyalgia   . Anxiety   . High cholesterol   . Hepatitis B   . Hepatitis C   . Back pain   . Osteoporosis    Past Surgical History  Procedure Laterality Date  . Rotator cuff repair    . Cesarean section     History reviewed. No pertinent family history. History  Substance Use Topics  . Smoking status: Current Some Day Smoker -- 0.50 packs/day    Types: Cigarettes  . Smokeless tobacco: Not on file  . Alcohol Use: No   OB History    No data available     Review of Systems  Constitutional: Positive for fatigue. Negative for fever, chills and diaphoresis.  HENT: Negative for congestion, rhinorrhea and sneezing.   Eyes: Negative.   Respiratory: Positive for shortness of breath. Negative for cough and chest tightness.   Cardiovascular: Negative for chest pain and leg swelling.  Gastrointestinal: Positive for nausea and diarrhea. Negative for vomiting, abdominal pain and blood in stool.  Genitourinary: Negative for frequency, hematuria, flank pain and difficulty urinating.  Musculoskeletal: Positive for back pain and arthralgias.  Skin: Negative for rash.  Neurological: Negative for dizziness, speech difficulty, weakness,  numbness and headaches.  Psychiatric/Behavioral: Positive for sleep disturbance. The patient is nervous/anxious.       Allergies  Latex; Nickel; Other; and Sulfa antibiotics  Home Medications   Prior to Admission medications   Medication Sig Start Date End Date Taking? Authorizing Provider  ARIPiprazole (ABILIFY PO) Take by mouth.    Historical Provider, MD  atorvastatin (LIPITOR) 10 MG tablet Take 10 mg by mouth daily.    Historical Provider, MD  DULoxetine HCl (CYMBALTA PO) Take by mouth.    Historical Provider, MD   ezetimibe (ZETIA) 10 MG tablet Take 10 mg by mouth daily.    Historical Provider, MD  fentaNYL (DURAGESIC - DOSED MCG/HR) 25 MCG/HR patch Place 25 mcg onto the skin every 3 (three) days.    Historical Provider, MD  meloxicam (MOBIC) 15 MG tablet Take 1 tablet (15 mg total) by mouth daily. 07/17/14   April Palumbo, MD  methocarbamol (ROBAXIN) 500 MG tablet Take 1 tablet (500 mg total) by mouth 2 (two) times daily. 07/17/14   April Palumbo, MD  naproxen (NAPROSYN) 500 MG tablet Take 1 tablet (500 mg total) by mouth 2 (two) times daily. 09/09/13   Hope Orlene OchM Neese, NP  Oxycodone HCl 10 MG TABS Take 10 mg by mouth.    Historical Provider, MD  SPIRONOLACTONE PO Take by mouth.    Historical Provider, MD   BP 133/77 mmHg  Pulse 85  Temp(Src) 98.6 F (37 C) (Oral)  Resp 12  SpO2 96% Physical Exam  Constitutional: She is oriented to person, place, and time. She appears well-developed and well-nourished.  HENT:  Head: Normocephalic and atraumatic.  Eyes: Pupils are equal, round, and reactive to light.  Neck: Normal range of motion. Neck supple.  Cardiovascular: Normal rate, regular rhythm and normal heart sounds.   Pulmonary/Chest: Effort normal and breath sounds normal. No respiratory distress. She has no wheezes. She has no rales. She exhibits no tenderness.  Patient is talking in full sentences with no increased work of breathing  Abdominal: Soft. Bowel sounds are normal. There is no tenderness. There is no rebound and no guarding.  Musculoskeletal: Normal range of motion. She exhibits no edema.  Lymphadenopathy:    She has no cervical adenopathy.  Neurological: She is alert and oriented to person, place, and time.  Skin: Skin is warm and dry. No rash noted.  Psychiatric: She has a normal mood and affect.    ED Course  Procedures (including critical care time) Labs Review Labs Reviewed  BASIC METABOLIC PANEL - Abnormal; Notable for the following:    Glucose, Bld 110 (*)    All other components  within normal limits  CBC WITH DIFFERENTIAL/PLATELET - Abnormal; Notable for the following:    Hemoglobin 15.4 (*)    Neutrophils Relative % 79 (*)    All other components within normal limits  BRAIN NATRIURETIC PEPTIDE  TROPONIN I    Imaging Review Dg Chest 2 View  09/07/2014   CLINICAL DATA:  Shortness of breath for 3 days  EXAM: CHEST  2 VIEW  COMPARISON:  07/17/2014  FINDINGS: Cardiomediastinal silhouette is stable. No acute infiltrate or pleural effusion. No pulmonary edema. Mild degenerative changes lower thoracic spine.  IMPRESSION: No active cardiopulmonary disease.   Electronically Signed   By: Natasha MeadLiviu  Pop M.D.   On: 09/07/2014 14:01     EKG Interpretation None      MDM   Final diagnoses:  Withdrawal from opioids  Dyspnea    Patient's labs are  unremarkable. She has no evidence of pneumonia or CHF. It's consistent with acute coronary syndrome. She has no suggestions of pulmonary embolus. She ambulated in the ED with normal oxygen saturations of 98-99%. She has no persistent tachycardia. She had no shortness of breath on ambulation. I feel her symptoms are likely related to her opioid withdrawal. She seems anxious related to this. She was advised to continue the clonidine. She also has Valium at home to use as needed for her anxiety. She was encouraged to follow-up with her primary care physician or her pain management physician. She was advised her return here for symptoms worsen.    Rolan Bucco, MD 09/07/14 [phone]
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
## 2019-06-09 ENCOUNTER — Ambulatory Visit: Payer: Medicaid Other | Attending: Family

## 2019-06-09 DIAGNOSIS — Z23 Encounter for immunization: Secondary | ICD-10-CM

## 2019-06-09 NOTE — Progress Notes (Signed)
   Covid-19 Vaccination Clinic  Name:  Makaylynn Bonillas    MRN: [id number] DOB: 1960-06-22  06/09/2019  Ms. Sheer was observed post Covid-19 immunization for 15 minutes without incident. She was provided with Vaccine Information Sheet and instruction to access the V-Safe system.   Ms. Mcphearson was instructed to call 911 with any severe reactions post vaccine: Marland Kitchen Difficulty breathing  . Swelling of face and throat  . A fast heartbeat  . A bad rash all over body  . Dizziness and weakness   Immunizations Administered    Name Date Dose VIS Date Route   Moderna COVID-19 Vaccine 06/09/2019  1:53 PM 0.5 mL 02/08/2019 Intramuscular   Manufacturer: Moderna   Lot: [id number]   NDC: 95974-718-55

## 2019-07-12 ENCOUNTER — Ambulatory Visit: Payer: Medicaid Other | Attending: Family

## 2019-07-12 DIAGNOSIS — Z23 Encounter for immunization: Secondary | ICD-10-CM

## 2019-07-12 NOTE — Progress Notes (Signed)
   Covid-19 Vaccination Clinic  Name:  Kristy Maldonado    MRN: [id number] DOB: [birth year]  07/12/2019  [name] was observed post Covid-19 immunization for 15 minutes without incident. She was provided with Vaccine Information Sheet and instruction to access the V-Safe system.   [name] was instructed to call 911 with any severe reactions post vaccine: Marland Kitchen Difficulty breathing  . Swelling of face and throat  . A fast heartbeat  . A bad rash all over body  . Dizziness and weakness   Immunizations Administered    Name Date Dose VIS Date Route   Moderna COVID-19 Vaccine 07/12/2019 12:07 PM 0.5 mL 02/2019 Intramuscular   Manufacturer: Moderna   Lot: [id number]   NDC: 74163-845-36

## 2021-09-29 ENCOUNTER — Emergency Department (HOSPITAL_BASED_OUTPATIENT_CLINIC_OR_DEPARTMENT_OTHER)
Admission: EM | Admit: 2021-09-29 | Discharge: 2021-09-29 | Disposition: A | Discharge status: Home / Self Care | Payer: Medicaid Other | Attending: Emergency Medicine | Admitting: Emergency Medicine

## 2021-09-29 ENCOUNTER — Other Ambulatory Visit: Payer: Self-pay

## 2021-09-29 ENCOUNTER — Emergency Department (HOSPITAL_BASED_OUTPATIENT_CLINIC_OR_DEPARTMENT_OTHER): Payer: Medicaid Other

## 2021-09-29 ENCOUNTER — Encounter (HOSPITAL_BASED_OUTPATIENT_CLINIC_OR_DEPARTMENT_OTHER): Payer: Self-pay | Admitting: Emergency Medicine

## 2021-09-29 DIAGNOSIS — Z91040 Latex allergy status: Secondary | ICD-10-CM | POA: Insufficient documentation

## 2021-09-29 DIAGNOSIS — R0602 Shortness of breath: Secondary | ICD-10-CM | POA: Diagnosis present

## 2021-09-29 DIAGNOSIS — Z79899 Other long term (current) drug therapy: Secondary | ICD-10-CM | POA: Diagnosis not present

## 2021-09-29 HISTORY — DX: Tachycardia, unspecified: R00.0

## 2021-09-29 LAB — COMPREHENSIVE METABOLIC PANEL
ALT: 22 U/L (ref 0–44)
AST: 30 U/L (ref 15–41)
Albumin: 4.3 g/dL (ref 3.5–5.0)
Alkaline Phosphatase: 68 U/L (ref 38–126)
Anion gap: 9 (ref 5–15)
BUN: 9 mg/dL (ref 8–23)
CO2: 29 mmol/L (ref 22–32)
Calcium: 9.4 mg/dL (ref 8.9–10.3)
Chloride: 101 mmol/L (ref 98–111)
Creatinine, Ser: 0.81 mg/dL (ref 0.44–1.00)
GFR, Estimated: >60 mL/min (ref >60)
Glucose, Bld: 126 mg/dL — ABNORMAL HIGH (ref 70–99)
Potassium: 3.8 mmol/L (ref 3.5–5.1)
Sodium: 139 mmol/L (ref 135–145)
Total Bilirubin: 0.8 mg/dL (ref 0.3–1.2)
Total Protein: 7.9 g/dL (ref 6.5–8.1)

## 2021-09-29 LAB — CBC WITH DIFFERENTIAL/PLATELET
Abs Immature Granulocytes: 0.05 10*3/uL (ref 0.00–0.07)
Basophils Absolute: 0.1 10*3/uL (ref 0.0–0.1)
Basophils Relative: 1 %
Eosinophils Absolute: 0.3 10*3/uL (ref 0.0–0.5)
Eosinophils Relative: 3 %
HCT: 44.9 % (ref 36.0–46.0)
Hemoglobin: 15 g/dL (ref 12.0–15.0)
Immature Granulocytes: 1 %
Lymphocytes Relative: 10 %
Lymphs Abs: 0.9 10*3/uL (ref 0.7–4.0)
MCH: 31.3 pg (ref 26.0–34.0)
MCHC: 33.4 g/dL (ref 30.0–36.0)
MCV: 93.7 fL (ref 80.0–100.0)
Monocytes Absolute: 0.7 10*3/uL (ref 0.1–1.0)
Monocytes Relative: 8 %
Neutro Abs: 6.6 10*3/uL (ref 1.7–7.7)
Neutrophils Relative %: 77 %
Platelets: 302 10*3/uL (ref 150–400)
RBC: 4.79 MIL/uL (ref 3.87–5.11)
RDW: 12.8 % (ref 11.5–15.5)
WBC: 8.6 10*3/uL (ref 4.0–10.5)
nRBC: 0 % (ref 0.0–0.2)

## 2021-09-29 LAB — LIPASE, BLOOD: Lipase: 35 U/L (ref 11–51)

## 2021-09-29 LAB — BRAIN NATRIURETIC PEPTIDE: B Natriuretic Peptide: 38.2 pg/mL (ref 0.0–100.0)

## 2021-09-29 LAB — TROPONIN I (HIGH SENSITIVITY): Troponin I (High Sensitivity): 3 ng/L (ref <18)

## 2021-09-29 LAB — D-DIMER, QUANTITATIVE: D-Dimer, Quant: 0.35 ug/mL-FEU (ref 0.00–0.50)

## 2021-09-29 MED ORDER — LORAZEPAM 1 MG PO TABS
1.0000 mg | ORAL_TABLET | Freq: Once | ORAL | Status: AC
  Administered 2021-09-29: 1 mg via ORAL
  Filled 2021-09-29: qty 1

## 2021-09-29 MED ORDER — SODIUM CHLORIDE 0.9 % IV BOLUS
1000.0000 mL | Freq: Once | INTRAVENOUS | Status: AC
  Administered 2021-09-29: 1000 mL via INTRAVENOUS

## 2021-09-29 NOTE — Discharge Instructions (Signed)
Follow-up with your primary care doctor.  Come back to ER if you are having worsening difficulty breathing, chest pain or other new concerning symptom.

## 2021-09-29 NOTE — ED Triage Notes (Addendum)
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

## 2021-09-29 NOTE — ED Provider Notes (Signed)
  Physical Exam  BP (!) 159/93   Pulse 71   Temp 98.6 F (37 C) (Oral)   Resp 18   Ht 5\' 1"  (1.549 m)   Wt 90.7 kg   SpO2 96%   BMI 37.79 kg/m   Physical Exam  Procedures  Procedures  ED Course / MDM    Medical Decision Making Amount and/or Complexity of Data Reviewed Labs: ordered. Radiology: ordered.  Risk Prescription drug management.   61 year old with chief complaint of difficulty breathing.  CXR without acute changes, D-dimer negative, troponin negative, no anemia, no electrolyte abnormality.  Awaiting bnp.  If negative anticipate discharge.  I reevaluated patient, she denies any ongoing difficulty in breathing.  Her vital signs are still stable.  I recommended follow-up with PCP.  Patient requested something for anxiety, provided oral dose of p.o. Ativan.       61, MD 09/29/21 [phone]

## 2021-09-29 NOTE — ED Provider Notes (Signed)
[facility] EMERGENCY DEPARTMENT Provider Note   CSN: [id number] Arrival date & time: 09/29/21  1305     History  Chief Complaint  Patient presents with   Anxiety    Kristy Maldonado is a 61 y.o. female.  61 yo F with a chief complaint of difficulty breathing.  This has been going on for about a week or so.  The patient had a corneal transplant prior to this.  She has been having some issues with that.  Having some pain especially to her right eye.  She also felt her vision is not great out of the right eye either.  She was recently seen by her ophthalmologist a couple days ago.  She denies any change to her medications at home.  She has been using the eyedrops as prescribed.  She has had tightness about her chest.  Has been having some diaphoresis.  Some orthopnea.  She also just feels jittery like she has to get up and walk around.  This symptom is worse with ambulation and actually seems to make them better.   Anxiety       Home Medications Prior to Admission medications   Medication Sig Start Date End Date Taking? Authorizing Provider  ARIPiprazole (ABILIFY PO) Take by mouth.    [provider]  atorvastatin (LIPITOR) 10 MG tablet Take 10 mg by mouth daily.    [provider]  DULoxetine HCl (CYMBALTA PO) Take by mouth.    [provider]  ezetimibe (ZETIA) 10 MG tablet Take 10 mg by mouth daily.    [provider]  fentaNYL (DURAGESIC - DOSED MCG/HR) 25 MCG/HR patch Place 25 mcg onto the skin every 3 (three) days.    [provider]  meloxicam (MOBIC) 15 MG tablet Take 1 tablet (15 mg total) by mouth daily. 07/17/14   Palumbo, April, MD  methocarbamol (ROBAXIN) 500 MG tablet Take 1 tablet (500 mg total) by mouth 2 (two) times daily. 07/17/14   Palumbo, April, MD  naproxen (NAPROSYN) 500 MG tablet Take 1 tablet (500 mg total) by mouth 2 (two) times daily. 09/09/13   Janne Napoleon, NP  Oxycodone HCl 10 MG TABS Take 10 mg by  mouth.    [provider]  SPIRONOLACTONE PO Take by mouth.    [provider]      Allergies    Latex, Nickel, Other, and Sulfa antibiotics    Review of Systems   Review of Systems  Physical Exam Updated Vital Signs BP (!) 159/93   Pulse 71   Temp 98.6 F (37 C) (Oral)   Resp 18   Ht 5\' 1"  (1.549 m)   Wt 90.7 kg   SpO2 96%   BMI 37.79 kg/m  Physical Exam Vitals and nursing note reviewed.  Constitutional:      General: She is not in acute distress.    Appearance: She is well-developed. She is not diaphoretic.  HENT:     Head: Normocephalic and atraumatic.  Eyes:     Pupils: Pupils are equal, round, and reactive to light.  Cardiovascular:     Rate and Rhythm: Normal rate and regular rhythm.     Heart sounds: No murmur heard.    No friction rub. No gallop.  Pulmonary:     Effort: Pulmonary effort is normal.     Breath sounds: No wheezing or rales.  Abdominal:     General: There is no distension.     Palpations: Abdomen is  soft.     Tenderness: There is no abdominal tenderness.  Musculoskeletal:        General: No tenderness.     Cervical back: Normal range of motion and neck supple.  Skin:    General: Skin is warm and dry.  Neurological:     Mental Status: She is alert and oriented to person, place, and time.  Psychiatric:        Behavior: Behavior normal.     ED Results / Procedures / Treatments   Labs (all labs ordered are listed, but only abnormal results are displayed) Labs Reviewed  COMPREHENSIVE METABOLIC PANEL - Abnormal; Notable for the following components:      Result Value   Glucose, Bld 126 (*)    All other components within normal limits  CBC WITH DIFFERENTIAL/PLATELET  LIPASE, BLOOD  D-DIMER, QUANTITATIVE  BRAIN NATRIURETIC PEPTIDE  TROPONIN I (HIGH SENSITIVITY)    EKG None  Radiology DG Chest Port 1 View  Result Date: 09/29/2021 CLINICAL DATA:  Shortness of breath. EXAM: PORTABLE CHEST 1 VIEW COMPARISON:   09/07/2014 FINDINGS: Normal heart size. No pleural effusion or edema. Scar versus platelike atelectasis noted in the lateral left lower lung. No airspace consolidation identified. The visualized osseous structures appear unremarkable. IMPRESSION: Scar versus platelike atelectasis in the left lower lung. Electronically Signed   By: Signa Kell M.D.   On: 09/29/2021 14:04    Procedures Procedures    Medications Ordered in ED Medications  sodium chloride 0.9 % bolus 1,000 mL (1,000 mLs Intravenous New Bag/Given 09/29/21 1415)    ED Course/ Medical Decision Making/ A&P                           Medical Decision Making Amount and/or Complexity of Data Reviewed Labs: ordered. Radiology: ordered.   61 yo F with a chief complaints of shortness of breath.  This been going on for about a week after having her cataracts removed and lens implanted.  She tells me that she has been a bit jittery and is just felt anxious and that she has to keep moving.  It sounds most like a dystonic reaction.  No obvious medication on her list that would cause the symptoms.  She has been getting steroid eyedrops I wonder if she has some systemic symptoms from that.  She is quite diaphoretic.  We will obtain a chest x-ray blood work reassess.   Chest x-ray independently interpreted by me negative for focal infiltrates or pneumothorax. Troponin negative, D-dimer negative.  No anemia no electrolyte abnormality.  Awaiting BNP.  Patient care was signed out to Dr. Stevie Kern, please see his note for further details of care in the ED.  The patients results and plan were reviewed and discussed.   Any x-rays performed were independently reviewed by myself.   Differential diagnosis were considered with the presenting HPI.  Medications  sodium chloride 0.9 % bolus 1,000 mL (1,000 mLs Intravenous New Bag/Given 09/29/21 1415)    Vitals:   09/29/21 1326 09/29/21 1331  BP: (!) 159/93   Pulse: 71   Resp: 18   Temp: 98.6 F  (37 C)   TempSrc: Oral   SpO2: 96%   Weight:  90.7 kg  Height:  5\' 1"  (1.549 m)    Final diagnoses:  SOB (shortness of breath) on exertion            Final Clinical Impression(s) / ED Diagnoses Final diagnoses:  SOB (  shortness of breath) on exertion    Rx / DC Orders ED Discharge Orders     None         Melene Plan, DO 09/29/21 1505

## 2022-02-21 ENCOUNTER — Other Ambulatory Visit: Payer: Self-pay

## 2022-02-21 DIAGNOSIS — M5416 Radiculopathy, lumbar region: Secondary | ICD-10-CM

## 2022-03-31 ENCOUNTER — Other Ambulatory Visit: Payer: Self-pay | Admitting: Nurse Practitioner

## 2022-03-31 DIAGNOSIS — M5416 Radiculopathy, lumbar region: Secondary | ICD-10-CM

## 2022-04-12 ENCOUNTER — Ambulatory Visit
Admission: RE | Admit: 2022-04-12 | Discharge: 2022-04-12 | Disposition: A | Discharge status: Home / Self Care | Payer: Medicaid Other | Source: Ambulatory Visit | Attending: Nurse Practitioner | Admitting: Nurse Practitioner

## 2022-04-12 DIAGNOSIS — M5416 Radiculopathy, lumbar region: Secondary | ICD-10-CM
